# Patient Record
Sex: Female | Born: 1960 | Race: Black or African American | Hispanic: No | Marital: Single
Health system: Southern US, Community
[De-identification: ages and names within clinical notes are randomized; demographics above are authoritative.]

## PROBLEM LIST (undated history)

## (undated) DIAGNOSIS — I10 Essential (primary) hypertension: Secondary | ICD-10-CM

## (undated) HISTORY — PX: ABDOMINAL HYSTERECTOMY: SHX81

---

## 1997-08-10 ENCOUNTER — Emergency Department (HOSPITAL_COMMUNITY): Admission: EM | Admit: 1997-08-10 | Discharge: 1997-08-10 | Payer: Self-pay | Admitting: Emergency Medicine

## 1997-09-30 ENCOUNTER — Inpatient Hospital Stay (HOSPITAL_COMMUNITY): Admission: EM | Admit: 1997-09-30 | Discharge: 1997-10-05 | Payer: Self-pay | Admitting: Emergency Medicine

## 2005-04-21 ENCOUNTER — Emergency Department (HOSPITAL_COMMUNITY): Admission: EM | Admit: 2005-04-21 | Discharge: 2005-04-21 | Payer: Self-pay | Admitting: Emergency Medicine

## 2009-02-23 ENCOUNTER — Emergency Department (HOSPITAL_COMMUNITY): Admission: EM | Admit: 2009-02-23 | Discharge: 2009-02-23 | Payer: Self-pay | Admitting: Emergency Medicine

## 2010-05-22 NOTE — ED Provider Notes (Signed)
°

## 2011-04-03 ENCOUNTER — Encounter: Payer: Self-pay | Admitting: Emergency Medicine

## 2011-04-03 ENCOUNTER — Emergency Department (INDEPENDENT_AMBULATORY_CARE_PROVIDER_SITE_OTHER)
Admission: EM | Admit: 2011-04-03 | Discharge: 2011-04-03 | Disposition: A | Discharge status: Home / Self Care | Payer: Self-pay | Source: Home / Self Care | Attending: Emergency Medicine | Admitting: Emergency Medicine

## 2011-04-03 DIAGNOSIS — S8000XA Contusion of unspecified knee, initial encounter: Secondary | ICD-10-CM

## 2011-04-03 DIAGNOSIS — M62838 Other muscle spasm: Secondary | ICD-10-CM

## 2011-04-03 HISTORY — DX: Essential (primary) hypertension: I10

## 2011-04-03 MED ORDER — IBUPROFEN 600 MG PO TABS: 600.0000 mg | ORAL_TABLET | Freq: Three times a day (TID) | ORAL | Status: AC | PRN

## 2011-04-03 MED ORDER — CYCLOBENZAPRINE HCL 10 MG PO TABS: 10.0000 mg | ORAL_TABLET | Freq: Two times a day (BID) | ORAL | Status: AC | PRN
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
## 2011-04-03 NOTE — ED Notes (Signed)
See above

## 2011-04-27 ENCOUNTER — Emergency Department (HOSPITAL_COMMUNITY)
Admission: EM | Admit: 2011-04-27 | Discharge: 2011-04-27 | Disposition: A | Discharge status: Home / Self Care | Payer: Self-pay | Attending: Emergency Medicine | Admitting: Emergency Medicine

## 2011-04-27 ENCOUNTER — Emergency Department (HOSPITAL_COMMUNITY): Payer: Self-pay

## 2011-04-27 ENCOUNTER — Encounter (HOSPITAL_COMMUNITY): Payer: Self-pay | Admitting: Emergency Medicine

## 2011-04-27 DIAGNOSIS — M25569 Pain in unspecified knee: Secondary | ICD-10-CM | POA: Insufficient documentation

## 2011-04-27 DIAGNOSIS — M25561 Pain in right knee: Secondary | ICD-10-CM

## 2011-04-27 MED ORDER — IBUPROFEN 800 MG PO TABS: 800.0000 mg | ORAL_TABLET | Freq: Three times a day (TID) | ORAL | Status: AC

## 2011-04-27 NOTE — ED Notes (Signed)
Patient transported to X-ray and back 
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
## 2011-04-28 NOTE — ED Provider Notes (Signed)
Medical screening examination/treatment/procedure(s) were performed by non-physician practitioner and as supervising physician I was immediately available for consultation/collaboration.  Kyandra Mcclaine, MD 04/28/11 1838 

## 2015-12-08 ENCOUNTER — Encounter (HOSPITAL_COMMUNITY): Payer: Self-pay | Admitting: Emergency Medicine

## 2015-12-08 ENCOUNTER — Ambulatory Visit (HOSPITAL_COMMUNITY)
Admission: EM | Admit: 2015-12-08 | Discharge: 2015-12-08 | Disposition: A | Discharge status: Home / Self Care | Payer: Self-pay | Attending: Family Medicine | Admitting: Family Medicine

## 2015-12-08 DIAGNOSIS — S6991XA Unspecified injury of right wrist, hand and finger(s), initial encounter: Secondary | ICD-10-CM

## 2015-12-08 DIAGNOSIS — Z23 Encounter for immunization: Secondary | ICD-10-CM

## 2015-12-08 DIAGNOSIS — S6981XA Other specified injuries of right wrist, hand and finger(s), initial encounter: Secondary | ICD-10-CM

## 2015-12-08 DIAGNOSIS — W458XXA Other foreign body or object entering through skin, initial encounter: Secondary | ICD-10-CM

## 2015-12-08 MED ORDER — DOXYCYCLINE HYCLATE 100 MG PO CAPS: 100.0000 mg | ORAL_CAPSULE | Freq: Two times a day (BID) | ORAL | 0 refills | Status: DC

## 2015-12-08 MED ORDER — TETANUS-DIPHTH-ACELL PERTUSSIS 5-2.5-18.5 LF-MCG/0.5 IM SUSP
0.5000 mL | Freq: Once | INTRAMUSCULAR | Status: AC
  Administered 2015-12-08: 0.5 mL via INTRAMUSCULAR

## 2015-12-08 MED ORDER — TETANUS-DIPHTH-ACELL PERTUSSIS 5-2.5-18.5 LF-MCG/0.5 IM SUSP
INTRAMUSCULAR | Status: AC
  Filled 2015-12-08: qty 0.5

## 2015-12-08 NOTE — ED Provider Notes (Signed)
CSN: [id number]     Arrival date & time 12/08/15  1220 History   None    Chief Complaint  Patient presents with  . Hand Injury   (Consider location/radiation/quality/duration/timing/severity/associated sxs/prior Treatment) Patient was fishing yesterday and she got a fish hook in her right index finger.   The history is provided by the patient.  Hand Injury  Location:  Finger Finger location:  R index finger Injury: no   Pain details:    Quality:  Dull   Radiates to:  Does not radiate   Severity:  Mild   Onset quality:  Sudden   Duration:  1 day   Timing:  Constant   Progression:  Unchanged Handedness:  Right-handed Dislocation: no   Foreign body present:  Unable to specify (fishhook) Prior injury to area:  No Relieved by:  Nothing Worsened by:  Nothing Ineffective treatments:  None tried   Past Medical History:  Diagnosis Date  . Hypertension    Past Surgical History:  Procedure Laterality Date  . ABDOMINAL HYSTERECTOMY     History reviewed. No pertinent family history. Social History  Substance Use Topics  . Smoking status: Current Every Day Smoker    Packs/day: 1.00    Types: Cigarettes  . Smokeless tobacco: Never Used  . Alcohol use Yes   OB History    No data available     Review of Systems  Constitutional: Negative.   HENT: Negative.   Eyes: Negative.   Respiratory: Negative.   Cardiovascular: Negative.   Gastrointestinal: Negative.   Endocrine: Negative.   Genitourinary: Negative.   Musculoskeletal: Negative.   Skin: Positive for wound.       Fish hook in right finger  Allergic/Immunologic: Negative.   Psychiatric/Behavioral: Negative.     Allergies  Review of patient's allergies indicates no known allergies.  Home Medications   Prior to Admission medications   Medication Sig Start Date End Date Taking? Authorizing Provider  doxycycline (VIBRAMYCIN) 100 MG capsule Take 1 capsule (100 mg total) by mouth 2 (two) times daily. 12/08/15    Deatra Canter, FNP  HYDROCODONE-ACETAMINOPHEN PO Take 1 tablet by mouth every 6 (six) hours as needed. PAIN.  PT STATES SHE DOESN'T KNOW WHAT SHE WAS PUT ON FOR PAIN. DOESN'T REMEMBER WHERE IT WAS FILLED EITHER     Historical Provider, MD   Meds Ordered and Administered this Visit   Medications  Tdap (BOOSTRIX) injection 0.5 mL (0.5 mLs Intramuscular Given 12/08/15 1407)    BP (!) 163/103 (BP Location: Left Arm) Comment: notified rn  Pulse 110   Temp 98.8 F (37.1 C) (Oral)   Resp 16   SpO2 100%  No data found.   Physical Exam  Constitutional: She appears well-developed and well-nourished.  HENT:  Head: Normocephalic and atraumatic.  Eyes: Pupils are equal, round, and reactive to light.  Cardiovascular: Normal rate, regular rhythm and normal heart sounds.   Pulmonary/Chest: Effort normal and breath sounds normal.  Skin:  Right index finger with fish hook  Nursing note and vitals reviewed.   Urgent Care Course   Clinical Course    .Foreign Body Removal Date/Time: 12/08/2015 2:37 PM Performed by: Deatra Canter Authorized by: Deatra Canter  Consent: The procedure was performed in an emergent situation. Verbal consent obtained. Written consent not obtained. Risks and benefits: risks, benefits and alternatives were discussed Consent given by: patient Patient understanding: patient states understanding of the procedure being performed Patient consent: the patient's understanding of the procedure  matches consent given Imaging studies: imaging studies available Required items: required blood products, implants, devices, and special equipment available Patient identity confirmed: verbally with patient Time out: Immediately prior to procedure a "time out" was called to verify the correct patient, procedure, equipment, support staff and site/side marked as required. Body area: skin General location: upper extremity Location details: right index finger Anesthesia:  local infiltration  Anesthesia: Local Anesthetic: lidocaine 1% without epinephrine Anesthetic total: 3 mL  Sedation: Patient sedated: no Patient restrained: no Localization method: visualized Removal mechanism: forceps Dressing: antibiotic ointment and dressing applied Tendon involvement: none Depth: subcutaneous Complexity: simple 1 objects recovered. Foreign bodies recovered: Fishhook. Patient tolerance: Patient tolerated the procedure well with no immediate complications Comments: After prepping sight with betadine and anesthetizing the hook is pulled straight out w/o difficulty.   (including critical care time)  Labs Review Labs Reviewed - No data to display  Imaging Review No results found.   Visual Acuity Review  Right Eye Distance:   Left Eye Distance:   Bilateral Distance:    Right Eye Near:   Left Eye Near:    Bilateral Near:         MDM   1. Fish hook injury of finger, right, initial encounter        Deatra CanterWilliam J Birda Didonato, FNP 12/08/15 1439
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
## 2017-04-28 ENCOUNTER — Emergency Department (HOSPITAL_COMMUNITY): Payer: Medicaid Other

## 2017-04-28 ENCOUNTER — Observation Stay (HOSPITAL_COMMUNITY)
Admission: EM | Admit: 2017-04-28 | Discharge: 2017-04-29 | Disposition: A | Discharge status: Home / Self Care | Payer: Medicaid Other | Attending: Oncology | Admitting: Oncology

## 2017-04-28 ENCOUNTER — Encounter (HOSPITAL_COMMUNITY): Payer: Self-pay

## 2017-04-28 ENCOUNTER — Other Ambulatory Visit: Payer: Self-pay

## 2017-04-28 DIAGNOSIS — I10 Essential (primary) hypertension: Secondary | ICD-10-CM | POA: Insufficient documentation

## 2017-04-28 DIAGNOSIS — E870 Hyperosmolality and hypernatremia: Secondary | ICD-10-CM | POA: Diagnosis not present

## 2017-04-28 DIAGNOSIS — Z7289 Other problems related to lifestyle: Secondary | ICD-10-CM

## 2017-04-28 DIAGNOSIS — E860 Dehydration: Secondary | ICD-10-CM | POA: Insufficient documentation

## 2017-04-28 DIAGNOSIS — E875 Hyperkalemia: Secondary | ICD-10-CM

## 2017-04-28 DIAGNOSIS — R4182 Altered mental status, unspecified: Secondary | ICD-10-CM

## 2017-04-28 DIAGNOSIS — E872 Acidosis: Secondary | ICD-10-CM | POA: Diagnosis not present

## 2017-04-28 DIAGNOSIS — F17210 Nicotine dependence, cigarettes, uncomplicated: Secondary | ICD-10-CM | POA: Insufficient documentation

## 2017-04-28 DIAGNOSIS — T68XXXA Hypothermia, initial encounter: Secondary | ICD-10-CM

## 2017-04-28 DIAGNOSIS — Z720 Tobacco use: Secondary | ICD-10-CM

## 2017-04-28 DIAGNOSIS — F10929 Alcohol use, unspecified with intoxication, unspecified: Secondary | ICD-10-CM | POA: Diagnosis present

## 2017-04-28 DIAGNOSIS — Z79899 Other long term (current) drug therapy: Secondary | ICD-10-CM | POA: Diagnosis not present

## 2017-04-28 DIAGNOSIS — D72829 Elevated white blood cell count, unspecified: Secondary | ICD-10-CM

## 2017-04-28 DIAGNOSIS — Z833 Family history of diabetes mellitus: Secondary | ICD-10-CM

## 2017-04-28 DIAGNOSIS — F10129 Alcohol abuse with intoxication, unspecified: Principal | ICD-10-CM | POA: Insufficient documentation

## 2017-04-28 DIAGNOSIS — Z789 Other specified health status: Secondary | ICD-10-CM

## 2017-04-28 LAB — I-STAT VENOUS BLOOD GAS, ED
ACID-BASE DEFICIT: 6 mmol/L — AB (ref 0.0–2.0)
Bicarbonate: 22.4 mmol/L (ref 20.0–28.0)
O2 SAT: 55 %
TCO2: 24 mmol/L (ref 22–32)
pCO2, Ven: 52.6 mmHg (ref 44.0–60.0)
pH, Ven: 7.236 — ABNORMAL LOW (ref 7.250–7.430)
pO2, Ven: 34 mmHg (ref 32.0–45.0)

## 2017-04-28 LAB — COMPREHENSIVE METABOLIC PANEL
ALT: 23 U/L (ref 14–54)
AST: 29 U/L (ref 15–41)
Albumin: 4.3 g/dL (ref 3.5–5.0)
Alkaline Phosphatase: 114 U/L (ref 38–126)
Anion gap: 10 (ref 5–15)
BUN: 9 mg/dL (ref 6–20)
CO2: 21 mmol/L — ABNORMAL LOW (ref 22–32)
Calcium: 9.2 mg/dL (ref 8.9–10.3)
Chloride: 109 mmol/L (ref 101–111)
Creatinine, Ser: 0.72 mg/dL (ref 0.44–1.00)
GFR calc Af Amer: >60 mL/min (ref >60)
GFR calc non Af Amer: >60 mL/min (ref >60)
Glucose, Bld: 98 mg/dL (ref 65–99)
Potassium: 5.3 mmol/L — ABNORMAL HIGH (ref 3.5–5.1)
Sodium: 140 mmol/L (ref 135–145)
Total Bilirubin: 0.4 mg/dL (ref 0.3–1.2)
Total Protein: 8.7 g/dL — ABNORMAL HIGH (ref 6.5–8.1)

## 2017-04-28 LAB — CBC WITH DIFFERENTIAL/PLATELET
BASOS ABS: 0 10*3/uL (ref 0.0–0.1)
Basophils Relative: 0 %
Eosinophils Absolute: 0.1 10*3/uL (ref 0.0–0.7)
Eosinophils Relative: 0 %
HEMATOCRIT: 54.3 % — AB (ref 36.0–46.0)
HEMOGLOBIN: 18.4 g/dL — AB (ref 12.0–15.0)
LYMPHS PCT: 36 %
Lymphs Abs: 4.4 10*3/uL — ABNORMAL HIGH (ref 0.7–4.0)
MCH: 30.7 pg (ref 26.0–34.0)
MCHC: 33.9 g/dL (ref 30.0–36.0)
MCV: 90.5 fL (ref 78.0–100.0)
MONO ABS: 0.4 10*3/uL (ref 0.1–1.0)
Monocytes Relative: 3 %
NEUTROS ABS: 7.4 10*3/uL (ref 1.7–7.7)
Neutrophils Relative %: 61 %
Platelets: 307 10*3/uL (ref 150–400)
RBC: 6 MIL/uL — AB (ref 3.87–5.11)
RDW: 13.5 % (ref 11.5–15.5)
WBC: 12.3 10*3/uL — ABNORMAL HIGH (ref 4.0–10.5)

## 2017-04-28 LAB — RAPID URINE DRUG SCREEN, HOSP PERFORMED
Amphetamines: NOT DETECTED
Barbiturates: NOT DETECTED
Benzodiazepines: NOT DETECTED
Cocaine: NOT DETECTED
OPIATES: NOT DETECTED
Tetrahydrocannabinol: NOT DETECTED

## 2017-04-28 LAB — URINALYSIS, COMPLETE (UACMP) WITH MICROSCOPIC
Bilirubin Urine: NEGATIVE
Glucose, UA: NEGATIVE mg/dL
Hgb urine dipstick: NEGATIVE
KETONES UR: NEGATIVE mg/dL
LEUKOCYTES UA: NEGATIVE
Nitrite: NEGATIVE
PROTEIN: NEGATIVE mg/dL
Specific Gravity, Urine: 1.003 — ABNORMAL LOW (ref 1.005–1.030)
pH: 6 (ref 5.0–8.0)

## 2017-04-28 LAB — VOLATILES,BLD-ACETONE,ETHANOL,ISOPROP,METHANOL
Acetone, blood: NOT DETECTED % (ref 0.000–0.010)
Ethanol, blood: 0.143 % — ABNORMAL HIGH (ref 0.000–0.010)
Isopropanol, blood: NOT DETECTED % (ref 0.000–0.010)
METHANOL, BLOOD: NOT DETECTED % (ref 0.000–0.010)

## 2017-04-28 LAB — I-STAT BETA HCG BLOOD, ED (MC, WL, AP ONLY)

## 2017-04-28 LAB — AMMONIA: AMMONIA: 33 umol/L (ref 9–35)

## 2017-04-28 LAB — PROTIME-INR
INR: 1.15
Prothrombin Time: 14.6 seconds (ref 11.4–15.2)

## 2017-04-28 LAB — I-STAT CG4 LACTIC ACID, ED: Lactic Acid, Venous: 2.24 mmol/L (ref 0.5–1.9)

## 2017-04-28 LAB — TSH: TSH: 0.643 u[IU]/mL (ref 0.350–4.500)

## 2017-04-28 LAB — OSMOLALITY: OSMOLALITY: 348 mosm/kg — AB (ref 275–295)

## 2017-04-28 LAB — SALICYLATE LEVEL

## 2017-04-28 LAB — ACETAMINOPHEN LEVEL: Acetaminophen (Tylenol), Serum: <10 ug/mL — ABNORMAL LOW (ref 10–30)

## 2017-04-28 LAB — ETHANOL: Alcohol, Ethyl (B): 300 mg/dL — ABNORMAL HIGH (ref <10)

## 2017-04-28 MED ORDER — SODIUM CHLORIDE 0.9 % IV BOLUS (SEPSIS)
1000.0000 mL | Freq: Once | INTRAVENOUS | Status: AC
  Administered 2017-04-28: 1000 mL via INTRAVENOUS

## 2017-04-28 MED ORDER — FOLIC ACID 1 MG PO TABS: 1.0000 mg | ORAL_TABLET | Freq: Every day | ORAL | Status: DC

## 2017-04-28 MED ORDER — LORAZEPAM 2 MG/ML IJ SOLN: 1.0000 mg | Freq: Four times a day (QID) | INTRAMUSCULAR | Status: DC | PRN

## 2017-04-28 MED ORDER — LORAZEPAM 2 MG/ML IJ SOLN: 1.0000 mg | Freq: Once | INTRAMUSCULAR | Status: DC

## 2017-04-28 MED ORDER — LORAZEPAM 2 MG/ML IJ SOLN
0.2500 mg | Freq: Once | INTRAMUSCULAR | Status: AC
Start: 2017-04-28 — End: 2017-04-28
  Administered 2017-04-28: 0.25 mg via INTRAVENOUS
  Filled 2017-04-28: qty 1

## 2017-04-28 MED ORDER — THIAMINE HCL 100 MG/ML IJ SOLN: 100.0000 mg | Freq: Every day | INTRAMUSCULAR | Status: DC

## 2017-04-28 MED ORDER — LORAZEPAM 2 MG/ML IJ SOLN
0.5000 mg | Freq: Once | INTRAMUSCULAR | Status: AC
  Administered 2017-04-28: 0.5 mg via INTRAVENOUS
  Filled 2017-04-28: qty 1

## 2017-04-28 MED ORDER — VITAMIN B-1 100 MG PO TABS: 100.0000 mg | ORAL_TABLET | Freq: Every day | ORAL | Status: DC

## 2017-04-28 MED ORDER — ADULT MULTIVITAMIN W/MINERALS CH
1.0000 | ORAL_TABLET | Freq: Every day | ORAL | Status: DC
  Administered 2017-04-29: 1 via ORAL
  Filled 2017-04-28: qty 1

## 2017-04-28 MED ORDER — LABETALOL HCL 5 MG/ML IV SOLN
20.0000 mg | Freq: Once | INTRAVENOUS | Status: DC
  Filled 2017-04-28: qty 4

## 2017-04-28 MED ORDER — LORAZEPAM 2 MG/ML IJ SOLN
0.5000 mg | Freq: Four times a day (QID) | INTRAMUSCULAR | Status: DC
  Administered 2017-04-28 – 2017-04-29 (×3): 0.5 mg via INTRAVENOUS
  Filled 2017-04-28 (×3): qty 1

## 2017-04-28 MED ORDER — LORAZEPAM 1 MG PO TABS: 1.0000 mg | ORAL_TABLET | Freq: Four times a day (QID) | ORAL | Status: DC | PRN

## 2017-04-28 MED ORDER — NICOTINE 14 MG/24HR TD PT24
14.0000 mg | MEDICATED_PATCH | TRANSDERMAL | Status: DC
  Administered 2017-04-28: 14 mg via TRANSDERMAL
  Filled 2017-04-28: qty 1

## 2017-04-28 MED ORDER — NICOTINE POLACRILEX 2 MG MT GUM
2.0000 mg | CHEWING_GUM | OROMUCOSAL | Status: DC | PRN
  Filled 2017-04-28: qty 1

## 2017-04-28 MED ORDER — ENOXAPARIN SODIUM 40 MG/0.4ML ~~LOC~~ SOLN
40.0000 mg | SUBCUTANEOUS | Status: DC
  Administered 2017-04-28: 40 mg via SUBCUTANEOUS
  Filled 2017-04-28: qty 0.4

## 2017-04-28 MED ORDER — VITAMIN B-1 100 MG PO TABS
100.0000 mg | ORAL_TABLET | Freq: Every day | ORAL | Status: DC
  Administered 2017-04-29: 100 mg via ORAL
  Filled 2017-04-28: qty 1

## 2017-04-28 MED ORDER — FOLIC ACID 1 MG PO TABS
1.0000 mg | ORAL_TABLET | Freq: Every day | ORAL | Status: DC
  Administered 2017-04-29: 1 mg via ORAL
  Filled 2017-04-28: qty 1

## 2017-04-28 MED ORDER — ADULT MULTIVITAMIN W/MINERALS CH: 1.0000 | ORAL_TABLET | Freq: Every day | ORAL | Status: DC

## 2017-04-28 NOTE — ED Notes (Signed)
Attempted report x1. 
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
## 2017-04-28 NOTE — ED Notes (Signed)
Pt placed on 2l /[place] for 89 sart. Will inform MD.
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
## 2017-04-28 NOTE — ED Provider Notes (Addendum)
[facility]ENC[facility] Provider Note   CSN: [id number] Arrival date & time: 04/28/17  1044     History   Chief Complaint No chief complaint on file.   HPI Courtney Carpenter is a 56 y.o. female. Level 5 caveat- altered mental status HPI 56 year old female brought in via EMS with reports of altered mental status.  They report that her aunt last saw her sleeping on the sofa at 730 this morning in her normal state.  EMS was called in the mid morning by neighbors who saw her outside laying on the ground.  Reports that the patient has been very confused and speaking inappropriately.  They received no prior history of similar events, significant past medical history which would cause this, drug or alcohol use or abuse.  Reports checking blood sugar in route that was normal.  EMS reports that there were 340 ounce beers found at the scene. Past Medical History:  Diagnosis Date  . Hypertension     There are no active problems to display for this patient.   Past Surgical History:  Procedure Laterality Date  . ABDOMINAL HYSTERECTOMY      OB History    No data available       Home Medications    Prior to Admission medications   Medication Sig Start Date End Date Taking? Authorizing Provider  doxycycline (VIBRAMYCIN) 100 MG capsule Take 1 capsule (100 mg total) by mouth 2 (two) times daily. 12/08/15   Deatra Canterxford, William J, FNP  HYDROCODONE-ACETAMINOPHEN PO Take 1 tablet by mouth every 6 (six) hours as needed. PAIN.  PT STATES SHE DOESN'T KNOW WHAT SHE WAS PUT ON FOR PAIN. DOESN'T REMEMBER WHERE IT WAS FILLED EITHER     [provider]    Family History No family history on file.  Social History Social History   Tobacco Use  . Smoking status: Current Every Day Smoker    Packs/day: 1.00    Types: Cigarettes  . Smokeless tobacco: Never Used  Substance Use Topics  . Alcohol use: Yes  . Drug use: No     Allergies   Patient has no known  allergies.   Review of Systems Review of Systems  All other systems reviewed and are negative.    Physical Exam Updated Vital Signs There were no vitals taken for this visit.  Physical Exam  Constitutional: She appears well-developed and well-nourished.  HENT:  Head: Normocephalic and atraumatic.  Eyes: EOM are normal. Pupils are equal, round, and reactive to light.  Neck: Normal range of motion. Neck supple.  Cardiovascular: Normal rate.  Pulmonary/Chest: Effort normal.  Abdominal: Soft.  Musculoskeletal: Normal range of motion.  Neurological:  Patient does not follow commands She appears to move all 4 extremities equally and with good strength There is no abnormal tone noted Unable to assess deep tendon reflexes or sensation  Skin:  Extremities are very cool and core is somewhat cool No signs of trauma are noted  Psychiatric: Her affect is labile and inappropriate. She is agitated. She is inattentive.  Nursing note and vitals reviewed.    ED Treatments / Results  Labs (all labs ordered are listed, but only abnormal results are displayed) Labs Reviewed  CULTURE, BLOOD (ROUTINE X 2)  CULTURE, BLOOD (ROUTINE X 2)  ETHANOL  BLOOD GAS, VENOUS  RAPID URINE DRUG SCREEN, HOSP PERFORMED  COMPREHENSIVE METABOLIC PANEL  CBC WITH DIFFERENTIAL/PLATELET  PROTIME-INR  URINALYSIS, ROUTINE W REFLEX MICROSCOPIC  URINALYSIS, COMPLETE (UACMP) WITH MICROSCOPIC  AMMONIA  CBG MONITORING, ED  I-STAT BETA HCG BLOOD, ED ([facility], [facility], [facility] ONLY)    EKG  EKG Interpretation  Date/Time:  Sunday April 28 2017 11:00:27 EST Ventricular Rate:  111 PR Interval:    QRS Duration: 142 QT Interval:  389 QTC Calculation: 529 R Axis:   -154 Text Interpretation:  Sinus tachycardia Consider right atrial enlargement RBBB and LPFB Baseline wander in lead(s) V2 Confirmed by Sanye Ledesma ([id number]) on 04/28/2017 11:29:55 AM       Radiology Dg Chest Port 1 View  Result Date: 04/28/2017 CLINICAL  DATA:  56 year old female with a history of altered mental status EXAM: PORTABLE CHEST 1 VIEW COMPARISON:  None. FINDINGS: Cardiomediastinal silhouette within normal limits. Low lung volumes accentuate the interstitium. No evidence of central vascular congestion. No pneumothorax or pleural effusion. No confluent airspace disease. IMPRESSION: Low lung volumes without definite evidence of acute cardiopulmonary disease. Electronically Signed   By: Jaime  Wagner D.O.   On: 04/28/2017 12:35    Procedures Procedures (including critical care time)  Medications Ordered in ED Medications  sodium chloride 0.9 % bolus 1,000 mL (not administered)  labetalol (NORMODYNE,TRANDATE) injection 20 mg (not administered)  LORazepam (ATIVAN) injection 0.5 mg (not administered)     Initial Impression / Assessment and Plan / ED Course  I have reviewed the triage vital signs and the nursing notes.  Pertinent labs & imaging results that were available during my care of the patient were reviewed by me and considered in my medical decision making (see chart for details).     12 :40 PM Patient given 0.5 mg ativan and decreased sats to 88%, [place] placed with sats 100. SBP at 102- labetalol held  1- altered mental status- etoh level 300, uds pending CT head pending 2- metabolic acidosis- initial ph 1.197.23,  3- hypothermia- likely secondary to exposure- patient found outside Bair hugger in place Temperature to be rechecked 2:51 PM Temp increased to 97.6 4- leukocytosis-mild and nonpecific- patient tachycardiac and hypotensive which may all be due to etoh and hypothermia, blood cultures obtained and infection, especially aspiration in ddx 5- hypotension- patient initially reported hypertensive but on recheck here 100, then decreased with ativan 0.5 mg.  IV fluids infusing, lactic acid pending Hypotension improved with iv fluids 6- hyperkalemia- mild at 5.3 will need recheck after iv fluids 7- lactic acid elevated at  2.24- will need to recheck in 3 hours- plan additional fluid 2:51 PM Patient responds to verbal stimuli otherwise hemodynamically improved.  Plan admission for ongoing treatment and evaluation.   Vitals:   04/28/17 1330 04/28/17 1345  BP: 101/65 (!) 133/92  Pulse: 100 (!) 105  Resp: 15 15  Temp:    SpO2: 100% 97%   Discussed with Dr. Vincente LibertyMolt and she will see for internal medicine service  Final Clinical Impressions(s) / ED Diagnoses   Final diagnoses:  Altered mental status, unspecified altered mental status type  Alcohol abuse with intoxication (HCC)  Hyperkalemia  Leukocytosis, unspecified type    ED Discharge Orders    None       Margarita Grizzleay, Wilson Sample, MD 04/28/17 1510    Margarita Grizzleay, Christyne Mccain, MD 04/28/17 [id number]

## 2017-04-28 NOTE — Progress Notes (Signed)
Patient is verbally and physically aggressive and threatening to leave. She keeps popping up and walking around and will not sit down. Patient refused telemetry. She started yanking out her left IV so I removed it. She is walking around as a high fall risk but is unable to be redirected to sit down for her safety.

## 2017-04-28 NOTE — Progress Notes (Signed)
Courtney Carpenter is a 56 y.o. female patient admitted from ED awake, alert - oriented  X 4 - no acute distress noted.  VSS - Blood pressure 137/80, pulse (!) 114, temperature 98 F (36.7 C), temperature source Oral, resp. rate 20, SpO2 100 %.    IV in place, occlusive dsg intact without redness.  Orientation to room, and floor completed with information packet given to patient/family.  Patient declined safety video at this time.  Admission INP armband ID verified with patient/family, and in place.   SR up x 2, fall assessment complete, with patient and family able to verbalize understanding of risk associated with falls, and verbalized understanding to call nsg before up out of bed.  Call light within reach, patient able to voice, and demonstrate understanding.  Skin, clean-dry- intact without evidence of bruising, or skin tears.   No evidence of skin break down noted on exam.     Will cont to eval and treat per MD orders.  Marca AnconaLaura M Onna Nodal, RN 04/28/2017 5:17 PM

## 2017-04-28 NOTE — ED Notes (Signed)
Pt able to answer questions appropriately at this time.

## 2017-04-28 NOTE — ED Notes (Signed)
Dr. Rosalia Hammersay notified of I stat lactic results

## 2017-04-28 NOTE — ED Triage Notes (Signed)
Pt arrives from home via EMS after she was found altered outside of home this morning. Pt lkw last night prior to going to bed. Unknown time. Pt was found with slurred speech, AMS. 3 40oz beers found at the scene. Only medical hx is HTN. At baseline pt is a&o x 4 with no neuro deficit. Pt verbally aggressive to staff calling this rn a bitch and stating she wants to "sit my pussy on a penis".

## 2017-04-28 NOTE — ED Notes (Signed)
Lab results given to Dr.Ray. 

## 2017-04-28 NOTE — ED Notes (Signed)
Pt returns from ct. I/O cath performed by this nurse. With tech. Vernona RiegerLaura

## 2017-04-28 NOTE — Progress Notes (Signed)
Called to patient's room by RN who stated patient being aggressive with staff and wanting to leave AMA when staff trying to place telemetry. When arrived to room hospital security in place.  Patient agitated and stated that she didn't understand why she was in hospital and wanted to go home. She was informed that she needed to stay in hospital given metabolic abnormalities and dehydration observed on admission. Patient stated she didn't care and wanted to go home anyway, in spite of risks of alcohol withdrawal and current clinical state. Patient pulled out IV on L hand during conversation as she was preparing to leave.   Patient wasn't sure how she arrived at hospital but knew that she wouldn't be able to get a ride home from any friends or family. Patient informed that hospital staff will be unable to arrange ride home if she leaves AMA. Because she couldn't get a ride home, she eventually decided to stay in hospital overnight for IVF and monitoring for alcohol withdrawal. Will continue to monitor for signs/symptoms of alcohol withdrawal using CIWA protocol.

## 2017-04-28 NOTE — ED Notes (Signed)
Main lab called stating they did not receive urine specimen.

## 2017-04-29 DIAGNOSIS — F10129 Alcohol abuse with intoxication, unspecified: Secondary | ICD-10-CM | POA: Diagnosis not present

## 2017-04-29 DIAGNOSIS — E870 Hyperosmolality and hypernatremia: Secondary | ICD-10-CM | POA: Diagnosis not present

## 2017-04-29 DIAGNOSIS — E860 Dehydration: Secondary | ICD-10-CM | POA: Diagnosis not present

## 2017-04-29 DIAGNOSIS — I10 Essential (primary) hypertension: Secondary | ICD-10-CM | POA: Diagnosis not present

## 2017-04-29 LAB — BLOOD CULTURE ID PANEL (REFLEXED)
ACINETOBACTER BAUMANNII: NOT DETECTED
CANDIDA ALBICANS: NOT DETECTED
CANDIDA GLABRATA: NOT DETECTED
CANDIDA KRUSEI: NOT DETECTED
Candida parapsilosis: NOT DETECTED
Candida tropicalis: NOT DETECTED
ENTEROBACTER CLOACAE COMPLEX: NOT DETECTED
ENTEROBACTERIACEAE SPECIES: NOT DETECTED
ESCHERICHIA COLI: NOT DETECTED
Enterococcus species: NOT DETECTED
Haemophilus influenzae: NOT DETECTED
KLEBSIELLA PNEUMONIAE: NOT DETECTED
Klebsiella oxytoca: NOT DETECTED
Listeria monocytogenes: NOT DETECTED
NEISSERIA MENINGITIDIS: NOT DETECTED
PSEUDOMONAS AERUGINOSA: NOT DETECTED
Proteus species: NOT DETECTED
STREPTOCOCCUS AGALACTIAE: NOT DETECTED
STREPTOCOCCUS PNEUMONIAE: NOT DETECTED
Serratia marcescens: NOT DETECTED
Staphylococcus aureus (BCID): NOT DETECTED
Staphylococcus species: NOT DETECTED
Streptococcus pyogenes: NOT DETECTED
Streptococcus species: DETECTED — AB

## 2017-04-29 LAB — BASIC METABOLIC PANEL
ANION GAP: 10 (ref 5–15)
BUN: 8 mg/dL (ref 6–20)
CALCIUM: 8.6 mg/dL — AB (ref 8.9–10.3)
CO2: 19 mmol/L — AB (ref 22–32)
Chloride: 107 mmol/L (ref 101–111)
Creatinine, Ser: 0.81 mg/dL (ref 0.44–1.00)
Glucose, Bld: 120 mg/dL — ABNORMAL HIGH (ref 65–99)
POTASSIUM: 3.6 mmol/L (ref 3.5–5.1)
Sodium: 136 mmol/L (ref 135–145)

## 2017-04-29 LAB — HIV ANTIBODY (ROUTINE TESTING W REFLEX): HIV SCREEN 4TH GENERATION: NONREACTIVE

## 2017-04-29 LAB — LACTIC ACID, PLASMA: LACTIC ACID, VENOUS: 1 mmol/L (ref 0.5–1.9)

## 2017-04-29 MED ORDER — SODIUM CHLORIDE 0.9 % IV BOLUS (SEPSIS)
1000.0000 mL | Freq: Once | INTRAVENOUS | Status: AC
  Administered 2017-04-29: 1000 mL via INTRAVENOUS

## 2017-04-29 NOTE — Progress Notes (Signed)
PHARMACY - PHYSICIAN COMMUNICATION CRITICAL VALUE ALERT - BLOOD CULTURE IDENTIFICATION (BCID)  Courtney GearKimberly R Offer is an 56 y.o. female who [facility] with a chief complaint of alcohol intoxication  Assessment:  No signs of infection  Name of physician (or Provider) Contacted: Dr Bobbye MortonNedrun  Current antibiotics: None  Changes to prescribed antibiotics recommended:  Recommendations accepted by provider - likely contaminant   Results for orders placed or performed during the hospital encounter of 04/28/17  Blood Culture ID Panel (Reflexed) (Collected: 04/28/2017  1:25 PM)  Result Value Ref Range   Enterococcus species NOT DETECTED NOT DETECTED   Listeria monocytogenes NOT DETECTED NOT DETECTED   Staphylococcus species NOT DETECTED NOT DETECTED   Staphylococcus aureus NOT DETECTED NOT DETECTED   Streptococcus species DETECTED (A) NOT DETECTED   Streptococcus agalactiae NOT DETECTED NOT DETECTED   Streptococcus pneumoniae NOT DETECTED NOT DETECTED   Streptococcus pyogenes NOT DETECTED NOT DETECTED   Acinetobacter baumannii NOT DETECTED NOT DETECTED   Enterobacteriaceae species NOT DETECTED NOT DETECTED   Enterobacter cloacae complex NOT DETECTED NOT DETECTED   Escherichia coli NOT DETECTED NOT DETECTED   Klebsiella oxytoca NOT DETECTED NOT DETECTED   Klebsiella pneumoniae NOT DETECTED NOT DETECTED   Proteus species NOT DETECTED NOT DETECTED   Serratia marcescens NOT DETECTED NOT DETECTED   Haemophilus influenzae NOT DETECTED NOT DETECTED   Neisseria meningitidis NOT DETECTED NOT DETECTED   Pseudomonas aeruginosa NOT DETECTED NOT DETECTED   Candida albicans NOT DETECTED NOT DETECTED   Candida glabrata NOT DETECTED NOT DETECTED   Candida krusei NOT DETECTED NOT DETECTED   Candida parapsilosis NOT DETECTED NOT DETECTED   Candida tropicalis NOT DETECTED NOT DETECTED   Isaac BlissMichael Eliese Kerwood, PharmD, BCPS, BCCCP Clinical Pharmacist Clinical phone for 04/29/2017 from  7a-3:30p: [phone] If after 3:30p, please call main pharmacy at: [phone] 04/29/2017 8:49 AM

## 2017-04-29 NOTE — Progress Notes (Signed)
   Subjective: Patient seen sitting comfortably in bed this AM eating breakfast in no acute distress. She was very calm this AM and states that she feels much better today. Still has questions about how she got to hospital. No acute complaints.  Objective:  Vital signs in last 24 hours: Vitals:   04/28/17 1643 04/28/17 1713 04/29/17 0001 04/29/17 0629  BP: 131/68 137/80 (!) 158/84 (!) 191/109  Pulse: (!) 108 (!) 114 (!) 137 (!) 116  Resp:  20 (!) 24 (!) 28  Temp:  98 F (36.7 C) 99.5 F (37.5 C) (!) 97.4 F (36.3 C)  TempSrc:  Oral Oral Oral  SpO2:  100% 99% 95%   Physical Exam  Constitutional: She appears well-developed and well-nourished. No distress.  HENT:  Mouth/Throat: Oropharynx is clear and moist.  Cardiovascular: Normal rate, regular rhythm and intact distal pulses.  No murmur heard. Respiratory: Effort normal. No respiratory distress. She has no wheezes.  No crackles appreciated  GI: Soft. Bowel sounds are normal. She exhibits no distension. There is no tenderness.  Skin: Skin is warm and dry. No rash noted. She is not diaphoretic. No erythema.  Psychiatric:  Flat affect. Less combative and talkative today. Thought content normal. Logic linear.   Assessment/Plan:  Principal Problem:   Hypothermia Active Problems:   Alcohol intoxication (HCC)   Tobacco use   Alcohol use   Alcohol abuse with intoxication (HCC)  Trey SailorsKimberly Rushing is a 56 yo with a PMH of HTN who [facility] with altered mental status. Upon arrival she was found to be hypothermic (64F) with intermittent tachycardia (HR 120), hypotension (100s/70s), and an elevated lactate. She was also found to have an ethanol level of 300. She was admitted to the internal medicine teaching service for management. The specific problems addressed during admission were as follows:  Acute alcohol intoxication with elevated lactate: The patient received IVF, 3L in total overnight. Morning  lactic acid within normal limits. Does not appear dehydrated on exam today. Her behavior towards examiner and staff is much less aggressive today. Given these improvements, patient stable for discharge to home today. Will consult SW for transportation issues. -Admission labs significant for ethanol, negative for opiates, salicylate, acetaminophen -BMP with low bicarb, no anion gap -1/2 blood cultures positive for strep species, likely contaminant as patient has no signs or symptoms of infection -SW consulted for transportation issues  Hypertension: Patient reports previous history of hypertension; currently hypertensive, but reluctant to start med during acute illness. Recommend outpatient PCP follow up. -Continue to monitor -Outpatient follow up with PCP  FEN/GI: -Regular diet -Discontinue IVF after 3L yesterday  VTE Prophylaxis: Lovenox daily  Dispo: Anticipated discharge today.   Rozann LeschesNedrud, Talis Iwan, MD 04/29/2017, 10:57 AM Pager: [phone]

## 2017-04-29 NOTE — Discharge Instructions (Signed)
Alcohol Abuse and Nutrition °Alcohol abuse is any pattern of alcohol consumption that harms your health, relationships, or work. Alcohol abuse can affect how your body breaks down and absorbs nutrients from food by causing your liver to work abnormally. Additionally, many people who abuse alcohol do not eat enough carbohydrates, protein, fat, vitamins, and minerals. This can cause poor nutrition (malnutrition) and a lack of nutrients (nutrient deficiencies), which can lead to further complications. °Nutrients that are commonly lacking (deficient) among people who abuse alcohol include: °· Vitamins. °? Vitamin A. This is stored in your liver. It is important for your vision, metabolism, and ability to fight off infections (immunity). °? B vitamins. These include vitamins such as folate, thiamin, and niacin. These are important in new cell growth and maintenance. °? Vitamin C. This plays an important role in iron absorption, wound healing, and immunity. °? Vitamin D. This is produced by your liver, but you can also get vitamin D from food. Vitamin D is necessary for your body to absorb and use calcium. °· Minerals. °? Calcium. This is important for your bones and your heart and blood vessel (cardiovascular) function. °? Iron. This is important for blood, muscle, and nervous system functioning. °? Magnesium. This plays an important role in muscle and nerve function, and it helps to control blood sugar and blood pressure. °? Zinc. This is important for the normal function of your nervous system and digestive system (gastrointestinal tract). ° °Nutrition is an essential component of therapy for alcohol abuse. Your health care provider or dietitian will work with you to design a plan that can help restore nutrients to your body and prevent potential complications. °What is my plan? °Your dietitian may develop a specific diet plan that is based on your condition and any other complications you may have. A diet plan will  commonly include: °· A balanced diet. °? Grains: 6-8 oz per day. °? Vegetables: 2-3 cups per day. °? Fruits: 1-2 cups per day. °? Meat and other protein: 5-6 oz per day. °? Dairy: 2-3 cups per day. °· Vitamin and mineral supplements. ° °What do I need to know about alcohol and nutrition? °· Consume foods that are high in antioxidants, such as grapes, berries, nuts, green tea, and dark green and orange vegetables. This can help to counteract some of the stress that is placed on your liver by consuming alcohol. °· Avoid food and drinks that are high in fat and sugar. Foods such as sugared soft drinks, salty snack foods, and candy contain empty calories. This means that they lack important nutrients such as protein, fiber, and vitamins. °· Eat frequent meals and snacks. Try to eat 5-6 small meals each day. °· Eat a variety of fresh fruits and vegetables each day. This will help you get plenty of water, fiber, and vitamins in your diet. °· Drink plenty of water and other clear fluids. Try to drink at least 48-64 oz (1.5-2 L) of water per day. °· If you are a vegetarian, eat a variety of protein-rich foods. Pair whole grains with plant-based proteins at meals and snacks to obtain the greatest nutrient benefit from your food. For example, eat rice with beans, put peanut butter on whole-grain toast, or eat oatmeal with sunflower seeds. °· Soak beans and whole grains overnight before cooking. This can help your body to absorb the nutrients more easily. °· Include foods fortified with vitamins and minerals in your diet. Commonly fortified foods include milk, orange juice, cereal, and bread. °·   If you are malnourished, your dietitian may recommend a high-protein, high-calorie diet. This may include: ? 2,000-3,000 calories (kilocalories) per day. ? 70-100 grams of protein per day.  Your health care provider may recommend a complete nutritional supplement beverage. This can help to restore calories, protein, and vitamins to  your body. Depending on your condition, you may be advised to consume this instead of or in addition to meals.  Limit your intake of caffeine. Replace drinks like coffee and black tea with decaffeinated coffee and herbal tea.  Eat a variety of foods that are high in omega fatty acids. These include fish, nuts and seeds, and soybeans. These foods may help your liver to recover and may also stabilize your mood.  Certain medicines may cause changes in your appetite, taste, and weight. Work with your health care provider and dietitian to make any adjustments to your medicines and diet plan.  Include other healthy lifestyle choices in your daily routine. ? Be physically active. ? Get enough sleep. ? Spend time doing activities that you enjoy.  If you are unable to take in enough food and calories by mouth, your health care provider may recommend a feeding tube. This is a tube that passes through your nose and throat, directly into your stomach. Nutritional supplement beverages can be given to you through the feeding tube to help you get the nutrients you need.  Take vitamin or mineral supplements as recommended by your health care provider. What foods can I eat? Grains Enriched pasta. Enriched rice. Fortified whole-grain bread. Fortified whole-grain cereal. Barley. Brown rice. Quinoa. [place]. Vegetables All fresh, frozen, and canned vegetables. Spinach. Kale. Artichoke. Carrots. Winter squash and pumpkin. Sweet potatoes. Broccoli. Cabbage. Cucumbers. Tomatoes. Sweet peppers. Green beans. Peas. Corn. Fruits All fresh and frozen fruits. Berries. Grapes. Mango. Papaya. Guava. Cherries. Apples. Bananas. Peaches. Plums. Pineapple. Watermelon. Cantaloupe. Oranges. Avocado. Meats and Other Protein Sources Beef liver. Lean beef. Pork. Fresh and canned chicken. Fresh fish. Oysters. Sardines. Canned tuna. Shrimp. Eggs with yolks. Nuts and seeds. Peanut butter. Beans and lentils. Soybeans.  Tofu. Dairy Whole, low-fat, and nonfat milk. Whole, low-fat, and nonfat yogurt. Cottage cheese. Sour cream. Hard and soft cheeses. Beverages Water. Herbal tea. Decaffeinated coffee. Decaffeinated green tea. 100% fruit juice. 100% vegetable juice. Instant breakfast shakes. Condiments Ketchup. Mayonnaise. Mustard. Salad dressing. Barbecue sauce. Sweets and Desserts Sugar-free ice cream. Sugar-free pudding. Sugar-free gelatin. Fats and Oils Butter. Vegetable oil, flaxseed oil, olive oil, and walnut oil. Other Complete nutrition shakes. Protein bars. Sugar-free gum. The items listed above may not be a complete list of recommended foods or beverages. Contact your dietitian for more options. What foods are not recommended? Grains Sugar-sweetened breakfast cereals. Flavored instant oatmeal. Fried breads. Vegetables Breaded or deep-fried vegetables. Fruits Dried fruit with added sugar. Candied fruit. Canned fruit in syrup. Meats and Other Protein Sources Breaded or deep-fried meats. Dairy Flavored milks. Fried cheese curds or fried cheese sticks. Beverages Alcohol. Sugar-sweetened soft drinks. Sugar-sweetened tea. Caffeinated coffee and tea. Condiments Sugar. Honey. Agave nectar. Molasses. Sweets and Desserts Chocolate. Cake. Cookies. Candy. Other Potato chips. Pretzels. Salted nuts. Candied nuts. The items listed above may not be a complete list of foods and beverages to avoid. Contact your dietitian for more information. This information is not intended to replace advice given to you by your health care provider. Make sure you discuss any questions you have with your health care provider. Document Released: 02/08/2005 Document Revised: 08/24/2015 Document Reviewed: 11/17/2013 Elsevier Interactive Patient Education  2018 Elsevier Inc.   Alcohol Intoxication Alcohol intoxication happens when a person cannot think clearly or function well (becomes impaired) after drinking alcohol.  This condition can happen even after one drink. It can be dangerous, especially if:  The person drank a lot of alcohol in a short amount of time (binge drinking).  The person also took certain medicines or drugs.  If the intoxication is very bad, a breathing machine (ventilator) may be needed until the alcohol wears off. Follow these instructions at home: General instructions  Do not drive after drinking alcohol.  Have someone stay with you. You should not be left alone while you have this condition.  Make sure you have enough fluid in your body. To do this: ? Drink enough fluid to keep your pee (urine) clear or pale yellow. ? Avoid caffeine. It can make you thirsty.  Take over-the-counter and prescription medicines only as told by your doctor. To prevent this condition:  Limit alcohol intake to no more than 1 drink a day for nonpregnant women and 2 drinks a day for men. One drink equals 12 oz of beer, 5 oz of wine, or 1 oz of hard liquor.  Do not drink alcohol on an empty stomach.  Avoid drinking alcohol if: ? You are under the legal drinking age. ? You are pregnant or may be pregnant. ? You are taking medicines that you should not take with alcohol. ? Your drinking causes your medical condition to get worse. ? You need to drive or do activities that require your attention. ? You have substance use disorder. If this condition happens again:  Get help right away if you or someone you know: ? Has medium or very bad trouble with:  Movement (coordination).  Speech.  Memory.  Attention. ? Passes out (faints). ? Is confused. ? Is throwing up (vomiting).  Do not leave someone with this condition alone. Contact a doctor if:  You do not feel better after a few days.  You are having problems at work, at school, or at home because of drinking. Get help right away if:  You get shaky when you try to stop drinking.  You start shaking and you cannot control it (have a  seizure).  You throw up blood. The blood may be bright red or look like coffee grounds.  You see blood in your poop (stool). The blood may: ? Be bright red. ? Make your poop black and tarry and make it smell bad.  You start to feel light-headed.  You pass out. If you ever feel like you may hurt yourself or others, or have thoughts about taking your own life, get help right away. You can go to your nearest emergency department or call:  Your local emergency services (911 in the [place]  A suicide crisis helpline, such as the National Suicide Prevention Lifeline at 612-526-88871-272-158-1984. This is open 24 hours a day.  This information is not intended to replace advice given to you by your health care provider. Make sure you discuss any questions you have with your health care provider. Document Released: 10/03/2007 Document Revised: 01/05/2016 Document Reviewed: 01/05/2016 Elsevier Interactive Patient Education  2017 [employer]

## 2017-04-29 NOTE — Progress Notes (Signed)
Orson GearKimberly R Porras to be D/C'd Home per MD order.  Discussed with the patient and all questions fully answered.  VSS, Skin clean, dry and intact without evidence of skin break down, no evidence of skin tears noted. IV catheter discontinued intact. Site without signs and symptoms of complications. Dressing and pressure applied.  An After Visit Summary was printed and given to the patient. Patient received prescription.  D/c education completed with patient/family including follow up instructions, medication list, d/c activities limitations if indicated, with other d/c instructions as indicated by MD - patient able to verbalize understanding, all questions fully answered.   Patient instructed to return to ED, call 911, or call MD for any changes in condition.   Patient escorted via WC, and D/C home via private auto.  Eligah Eastrin M Jahayra Mazo 04/29/2017 12:11 PM

## 2017-04-29 NOTE — Care Management Note (Signed)
Case Management Note  Patient Details  Name: Courtney Carpenter MRN: [id number] Date of Birth: 05/07/1960  Subjective/Objective:                    Action/Plan: Pt discharging home with self care. No changes to her current medications. [facility]. [facility] Pt states she lives with her aunt who is not able to provide transportation home for the patient. Pt asking for a bus pass. CSW aware.  No further needs per CM.  Expected Discharge Date:  04/29/17               Expected Discharge Plan:  Home/Self Care  In-House Referral:     Discharge planning Services     Post Acute Care Choice:    Choice offered to:     DME Arranged:    DME Agency:     HH Arranged:    HH Agency:     Status of Service:  Completed, signed off  If discussed at [employer] Length of Stay Meetings, dates discussed:    Additional Comments:  Kermit BaloKelli F Melbourne Jakubiak, RN 04/29/2017, 12:08 PM

## 2017-04-29 NOTE — Discharge Summary (Signed)
Name: Courtney Carpenter MRN: [id number] DOB: 1960-08-20 56 y.o. PCP: Marliss Coots, NP  Date of Admission: 04/28/2017 10:44 AM Date of Discharge: 04/29/2017 Attending Physician: Dr. Murriel Hopper  Discharge Diagnosis:  Principal Problem:   Hypothermia Active Problems:   Alcohol intoxication ([place])   Tobacco use   Alcohol use   Alcohol abuse with intoxication [facility])  Discharge Medications: Allergies as of 04/29/2017   No Known Allergies     Medication List    You have not been prescribed any medications.    Disposition and follow-up:   Courtney Carpenter was discharged from [facility] in Stable condition.  At the hospital follow up visit please address:  1.  Patient was admitted to the hospital after an episode of acute intoxication. She was found to by hypothermic and body temp improved with warming blanket. She was given aggressive IV hydration and improved with this therapy. Please assess willingness to stop drinking, as family reports patient daily heavy drinker.   Patient's BP intermittently elevated during hospitalization. Please assess if she needs outpatient BP medications.  2.  Labs / imaging needed at time of follow-up: BP, as she had asymptomatic hypertension towards the end of admission  3.  Pending labs/ test needing follow-up: None  Follow-up Appointments: Follow-up Information    Placey, Audrea Muscat, NP. Schedule an appointment as soon as possible for a visit in 1 week(s).   Why:  Please make appointment in 1 week to discuss your hospitalization Contact information: [place] [address] [phone]           Hospital Course by problem list: Principal Problem:   Hypothermia Active Problems:   Alcohol intoxication ([place])   Tobacco use   Alcohol use   Alcohol abuse with intoxication ([place])   Courtney Carpenter is a 56 yo with a PMH of HTN who presented [facility] via EMS with altered  mental status. Upon arrival she was found to be hypothermic (38F) with intermittent tachycardia (HR 120), hypotension (100s/70s), and an elevated lactate. She was also found to have an ethanol level of 300. She was admitted to the internal medicine teaching service for management. The specific problems addressed during admission were as follows:  Acute alcohol intoxication with elevated lactate: The patient's lactate was elevated to 2.24 on arrival and she received 3L IVF overnight. Her initial BMP did not show an anion gap and no osmolar gap was observed on admission labs. After fluid resuscitation, her lactic acid within normal limits. Her other labs taken on admission, including UDS, salicylate, and acetaminophen returned negative. The patient met SIRS criteria on admission, so blood cultures were obtained. One of 2 blood cultures returned positive for strep species, which was thought to a contaminant as the patient remained afebrile, hemodynamically stable, and without signs or symptoms of infection during hospitalization. The patient's ethanol level was 300 on admission. She stated that she did not usually drink every day, but collateral reports from family suggest patient chronic alcoholic who drinks daily. Patient did not confirm this and was aggressive towards staff throughout hospitalization. She was monitored on CIWA and given scheduled Ativan 0.5 mg q6 hours to prevent withdrawal. Please assess patient's willingness to quit drinking.   Hypertension: Patient reports previous history of hypertension, but was hypo/normotensive on arrival. The patient was hypertensive at discharge and recommended follow up with PCP regarding BP management.  Discharge Vitals:   BP (!) 191/109 (BP Location: Left  Arm)   Pulse (!) 116   Temp (!) 97.4 F (36.3 C) (Oral)   Resp (!) 28   SpO2 95%   Pertinent Labs, Studies, and Procedures:   BMP Latest Ref Rng & Units 04/29/2017 04/28/2017  Glucose 65 - 99 mg/dL  120(H) 98  BUN 6 - 20 mg/dL 8 9  Creatinine 0.44 - 1.00 mg/dL 0.81 0.72  Sodium 135 - 145 mmol/L 136 140  Potassium 3.5 - 5.1 mmol/L 3.6 5.3(H)  Chloride 101 - 111 mmol/L 107 109  CO2 22 - 32 mmol/L 19(L) 21(L)  Calcium 8.9 - 10.3 mg/dL 8.6(L) 9.2   CBC Latest Ref Rng & Units 04/28/2017  WBC 4.0 - 10.5 K/uL 12.3(H)  Hemoglobin 12.0 - 15.0 g/dL 18.4(H)  Hematocrit 36.0 - 46.0 % 54.3(H)  Platelets 150 - 400 K/uL 307   Ethanol = 300 Serum Osmolality = 348  TSH = 0.643 Ammonia = 33  Lactate (serial, 12 hours later) = 2.24, 1.0  Drugs of Abuse     Component Value Date/Time   LABOPIA NONE DETECTED 04/28/2017 1410   COCAINSCRNUR NONE DETECTED 04/28/2017 1410   LABBENZ NONE DETECTED 04/28/2017 1410   AMPHETMU NONE DETECTED 04/28/2017 1410   THCU NONE DETECTED 04/28/2017 1410   LABBARB NONE DETECTED 04/28/2017 1410    Urinalysis    Component Value Date/Time   COLORURINE STRAW (A) 04/28/2017 1410   APPEARANCEUR CLEAR 04/28/2017 1410   LABSPEC 1.003 (L) 04/28/2017 1410   PHURINE 6.0 04/28/2017 1410   GLUCOSEU NEGATIVE 04/28/2017 1410   HGBUR NEGATIVE 04/28/2017 1410   BILIRUBINUR NEGATIVE 04/28/2017 1410   KETONESUR NEGATIVE 04/28/2017 1410   PROTEINUR NEGATIVE 04/28/2017 1410   NITRITE NEGATIVE 04/28/2017 [place] 04/28/2017 1410   Discharge Instructions: Discharge Instructions    Call MD for:  persistant dizziness or light-headedness   Complete by:  As directed    Call MD for:  persistant nausea and vomiting   Complete by:  As directed    Call MD for:  temperature >100.4   Complete by:  As directed    Diet - low sodium heart healthy   Complete by:  As directed    Discharge instructions   Complete by:  As directed    You were treated for electrolyte abnormalities that were caused by acute alcohol intoxication. While in the hospital your blood pressure was high. Please follow up with your primary care physician regarding your high blood pressure.     Increase activity slowly   Complete by:  As directed       Signed: Thomasene Ripple, MD 04/30/2017, 7:44 AM   Pager: [phone]

## 2017-04-30 LAB — CULTURE, BLOOD (ROUTINE X 2): SPECIAL REQUESTS: ADEQUATE

## 2017-05-04 LAB — CULTURE, BLOOD (ROUTINE X 2): Special Requests: ADEQUATE

## 2020-10-19 ENCOUNTER — Other Ambulatory Visit: Payer: Self-pay | Admitting: Internal Medicine

## 2020-10-19 DIAGNOSIS — Z1231 Encounter for screening mammogram for malignant neoplasm of breast: Secondary | ICD-10-CM

## 2020-10-19 DIAGNOSIS — M810 Age-related osteoporosis without current pathological fracture: Secondary | ICD-10-CM

## 2020-12-13 ENCOUNTER — Other Ambulatory Visit: Payer: Self-pay

## 2020-12-13 ENCOUNTER — Ambulatory Visit
Admission: RE | Admit: 2020-12-13 | Discharge: 2020-12-13 | Disposition: A | Discharge status: Home / Self Care | Payer: Medicare (Managed Care) | Source: Ambulatory Visit | Attending: Internal Medicine | Admitting: Internal Medicine

## 2020-12-13 DIAGNOSIS — Z1231 Encounter for screening mammogram for malignant neoplasm of breast: Secondary | ICD-10-CM

## 2021-02-06 ENCOUNTER — Other Ambulatory Visit (HOSPITAL_COMMUNITY): Payer: Self-pay | Admitting: Hospitalist

## 2021-02-06 DIAGNOSIS — R5383 Other fatigue: Secondary | ICD-10-CM

## 2021-02-06 DIAGNOSIS — R0609 Other forms of dyspnea: Secondary | ICD-10-CM

## 2021-02-21 ENCOUNTER — Other Ambulatory Visit: Payer: Self-pay

## 2021-02-21 ENCOUNTER — Ambulatory Visit (HOSPITAL_COMMUNITY): Payer: Medicare (Managed Care) | Attending: Internal Medicine

## 2021-02-21 DIAGNOSIS — R0609 Other forms of dyspnea: Secondary | ICD-10-CM | POA: Diagnosis present

## 2021-02-21 DIAGNOSIS — R5383 Other fatigue: Secondary | ICD-10-CM | POA: Insufficient documentation

## 2021-02-21 LAB — ECHOCARDIOGRAM COMPLETE
Area-P 1/2: 5.66 cm2
S' Lateral: 2.5 cm

## 2021-04-03 ENCOUNTER — Other Ambulatory Visit: Payer: Self-pay

## 2021-08-02 ENCOUNTER — Other Ambulatory Visit: Payer: Self-pay | Admitting: Hospitalist

## 2021-08-25 ENCOUNTER — Other Ambulatory Visit: Payer: Self-pay | Admitting: Internal Medicine

## 2021-08-25 ENCOUNTER — Ambulatory Visit
Admission: RE | Admit: 2021-08-25 | Discharge: 2021-08-25 | Disposition: A | Discharge status: Home / Self Care | Payer: Self-pay | Source: Ambulatory Visit | Attending: Internal Medicine | Admitting: Internal Medicine

## 2021-08-25 DIAGNOSIS — Z13820 Encounter for screening for osteoporosis: Secondary | ICD-10-CM

## 2021-09-21 ENCOUNTER — Other Ambulatory Visit: Payer: Self-pay | Admitting: Family Medicine

## 2021-09-21 DIAGNOSIS — Z0001 Encounter for general adult medical examination with abnormal findings: Secondary | ICD-10-CM

## 2021-10-23 ENCOUNTER — Other Ambulatory Visit: Payer: Medicare (Managed Care)

## 2021-11-20 ENCOUNTER — Ambulatory Visit
Admission: RE | Admit: 2021-11-20 | Discharge: 2021-11-20 | Disposition: A | Discharge status: Home / Self Care | Payer: Medicare (Managed Care) | Source: Ambulatory Visit | Attending: Family Medicine | Admitting: Family Medicine

## 2021-11-20 DIAGNOSIS — Z0001 Encounter for general adult medical examination with abnormal findings: Secondary | ICD-10-CM

## 2022-01-18 ENCOUNTER — Other Ambulatory Visit (HOSPITAL_COMMUNITY): Payer: Self-pay | Admitting: Family Medicine

## 2022-01-18 DIAGNOSIS — I495 Sick sinus syndrome: Secondary | ICD-10-CM

## 2022-02-23 ENCOUNTER — Ambulatory Visit (HOSPITAL_COMMUNITY)
Admission: RE | Admit: 2022-02-23 | Discharge: 2022-02-23 | Disposition: A | Discharge status: Home / Self Care | Payer: Medicare (Managed Care) | Source: Ambulatory Visit | Attending: Family Medicine | Admitting: Family Medicine

## 2022-02-23 DIAGNOSIS — R000 Tachycardia, unspecified: Secondary | ICD-10-CM | POA: Insufficient documentation

## 2022-02-23 DIAGNOSIS — I4510 Unspecified right bundle-branch block: Secondary | ICD-10-CM | POA: Diagnosis present

## 2022-06-27 ENCOUNTER — Other Ambulatory Visit: Payer: Self-pay

## 2022-06-27 DIAGNOSIS — Z1231 Encounter for screening mammogram for malignant neoplasm of breast: Secondary | ICD-10-CM

## 2022-08-10 ENCOUNTER — Inpatient Hospital Stay: Admission: RE | Admit: 2022-08-10 | Payer: Medicare (Managed Care) | Source: Ambulatory Visit

## 2022-09-19 ENCOUNTER — Ambulatory Visit: Payer: Medicare (Managed Care)

## 2022-10-01 ENCOUNTER — Ambulatory Visit
Admission: RE | Admit: 2022-10-01 | Discharge: 2022-10-01 | Disposition: A | Discharge status: Home / Self Care | Payer: Medicare (Managed Care) | Source: Ambulatory Visit

## 2022-10-01 DIAGNOSIS — Z1231 Encounter for screening mammogram for malignant neoplasm of breast: Secondary | ICD-10-CM

## 2023-04-14 IMAGING — MG MM DIGITAL SCREENING BILAT W/ TOMO AND CAD
8 series · 9 of 24 positions shown · non-contrast
Comparison: None.

CLINICAL DATA: Screening.

EXAM:
DIGITAL SCREENING BILATERAL MAMMOGRAM WITH TOMOSYNTHESIS AND CAD
TECHNIQUE: Bilateral screening digital craniocaudal and mediolateral oblique
mammograms were obtained. Bilateral screening digital breast
tomosynthesis was performed. The images were evaluated with
computer-aided detection.

[L MLO synth-2D]
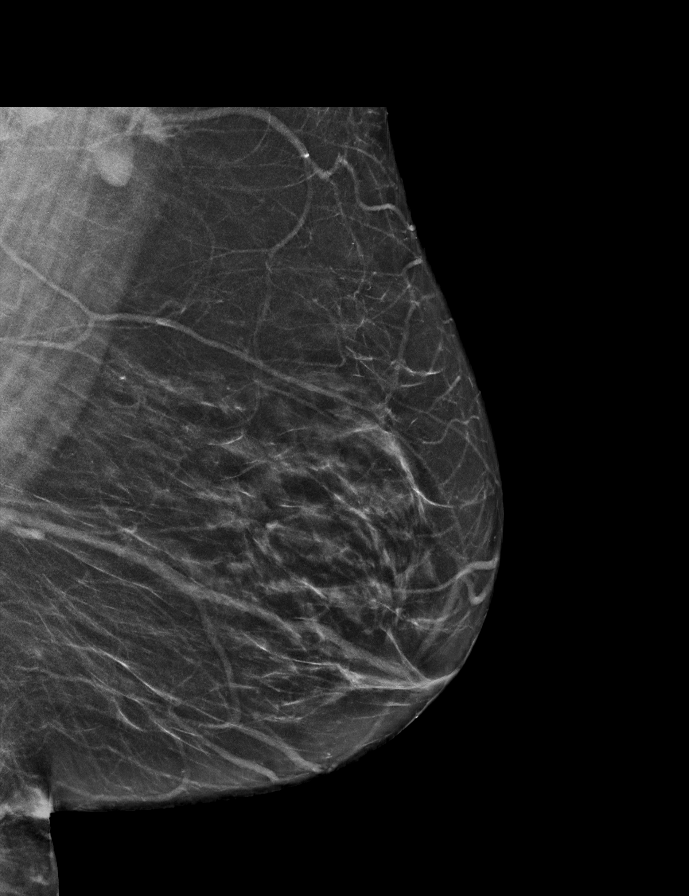

[L CC synth-2D]
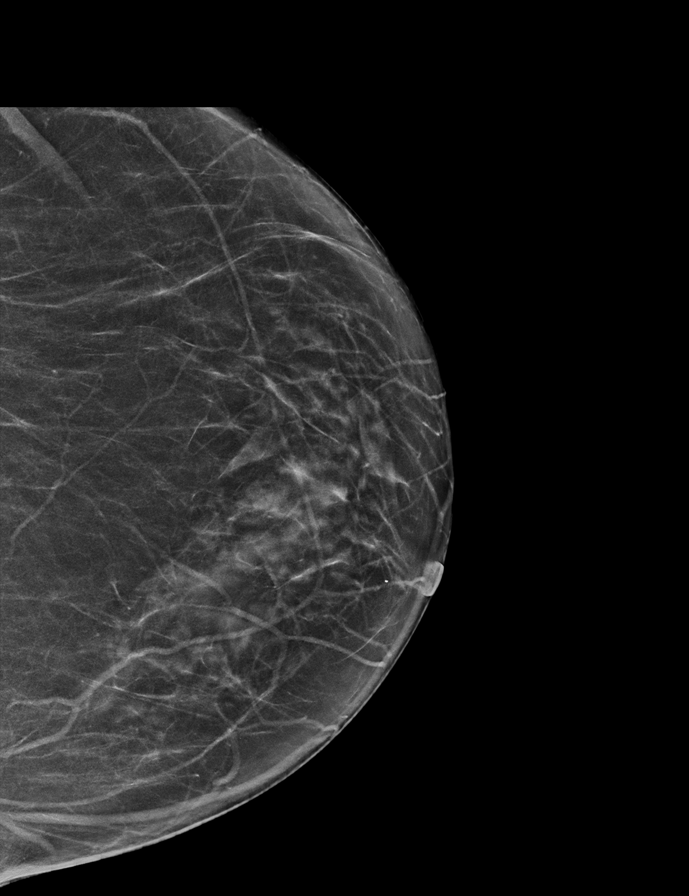

[R MLO synth-2D]
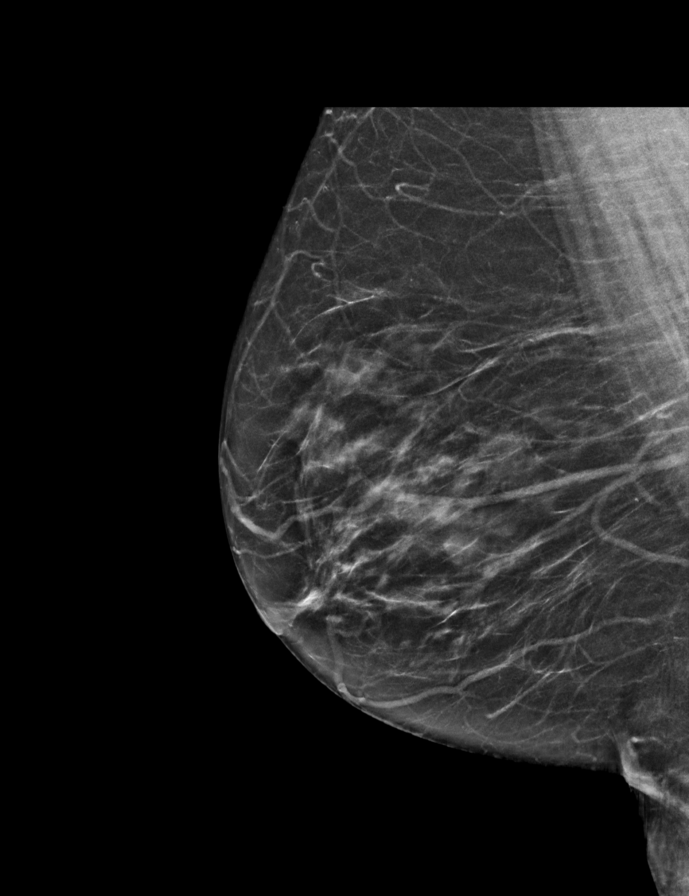

[R CC synth-2D]
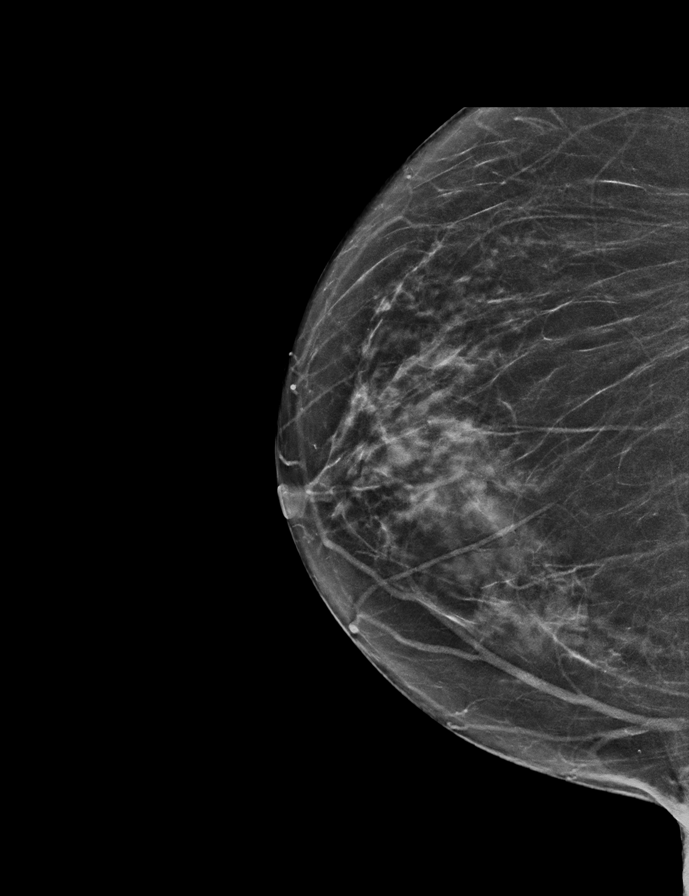

[L CC tomo · 2 of 55 frames shown]
[frame 18/55]
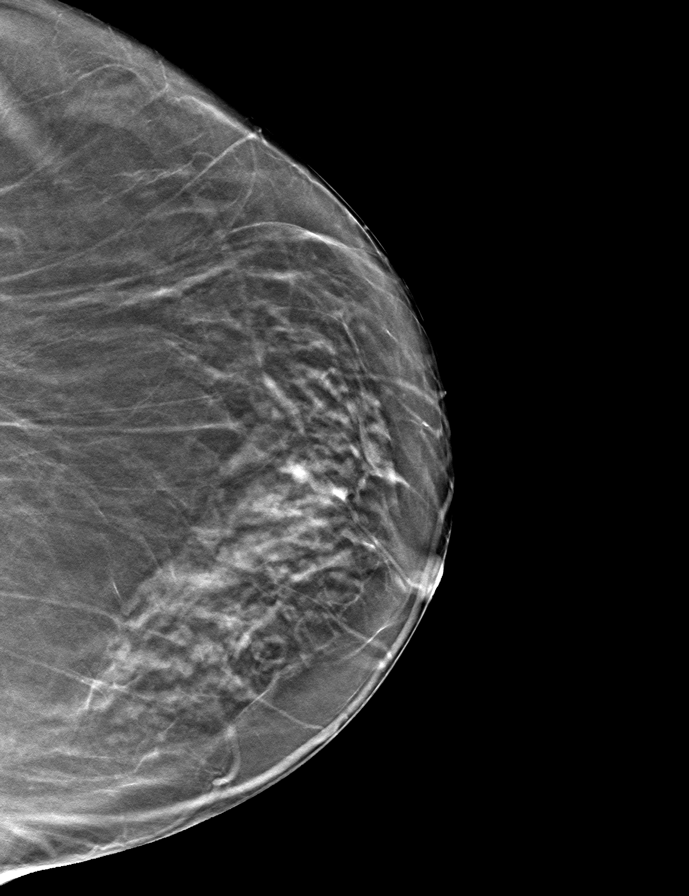
[frame 28/55]
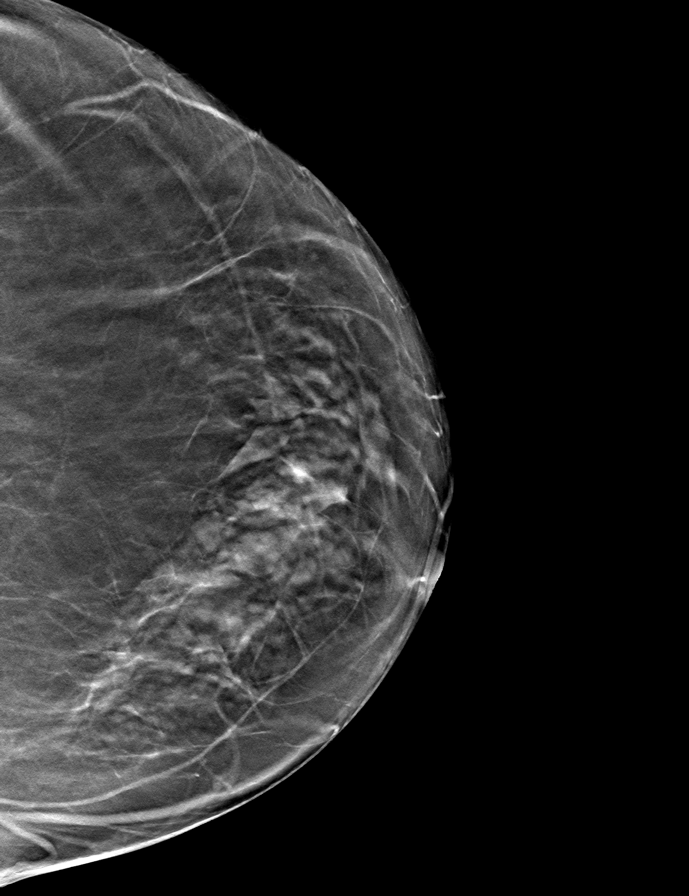

[L MLO tomo · tomo slice 30/59.0]
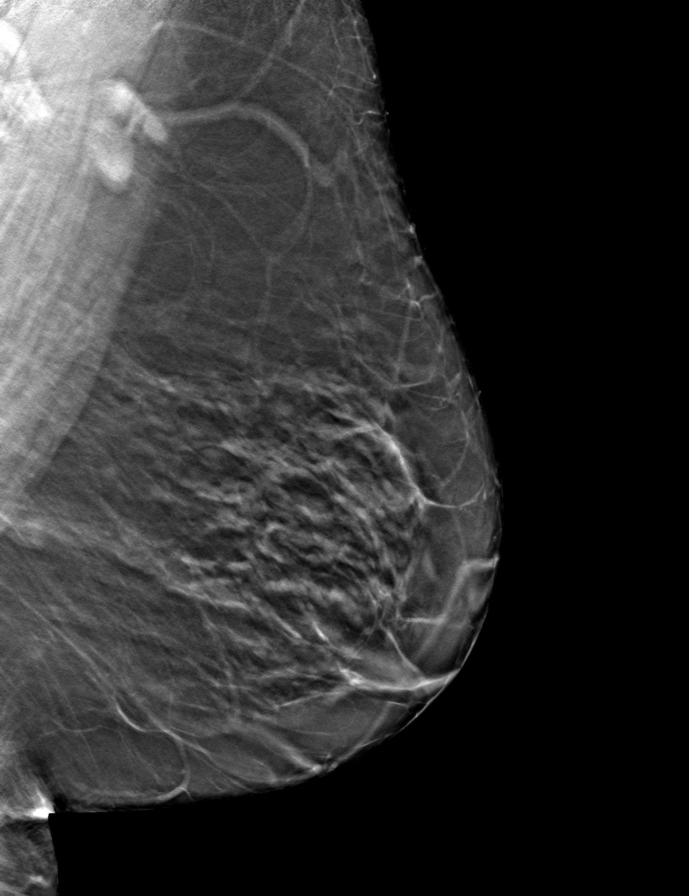

[R CC tomo · tomo slice 27/54.0]
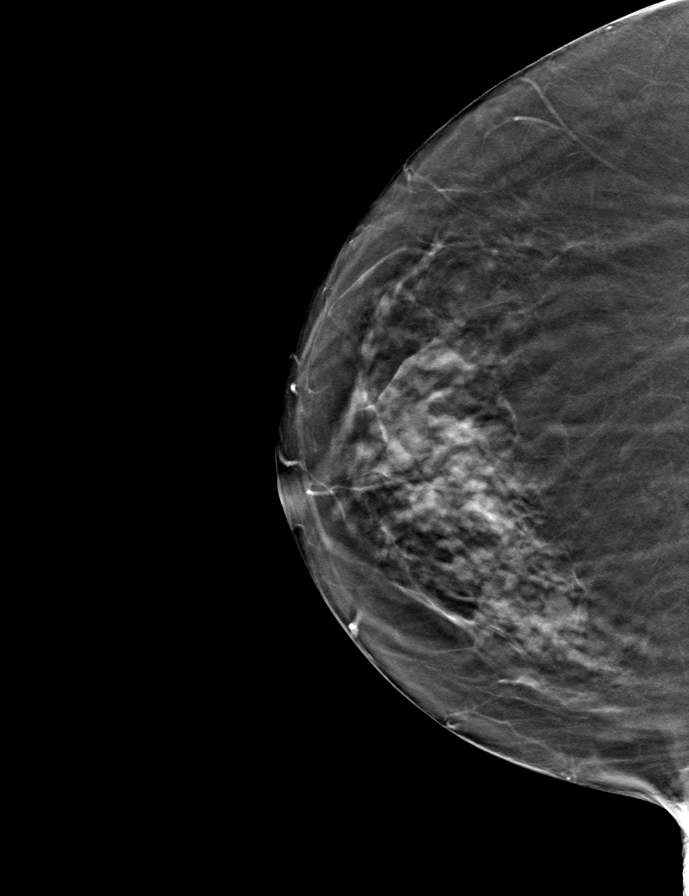

[R MLO tomo · tomo slice 28/55.0]
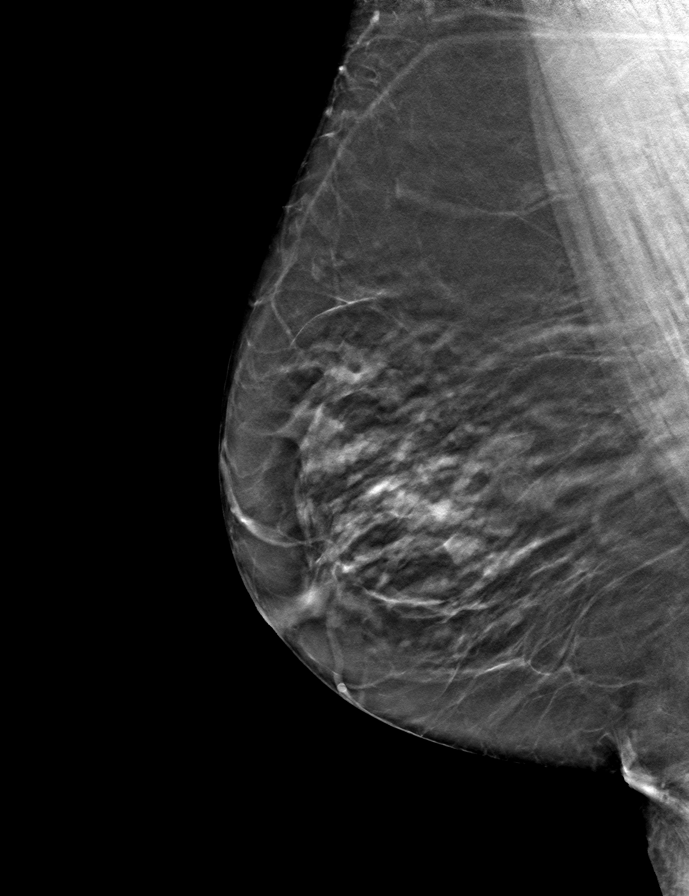

[9 of 24 positions shown; findings below may reference images not displayed]

ACR Breast Density Category b: There are scattered areas of
fibroglandular density.
FINDINGS: There are no findings suspicious for malignancy.
IMPRESSION: No mammographic evidence of malignancy. A result letter of this
screening mammogram will be mailed directly to the patient.

RECOMMENDATION:
Screening mammogram in one year. (Code:XG-X-X7B)

BI-RADS CATEGORY  1: Negative.

## 2023-04-22 ENCOUNTER — Encounter: Payer: Self-pay | Admitting: Emergency Medicine

## 2023-07-15 ENCOUNTER — Other Ambulatory Visit: Payer: Self-pay | Admitting: Physician Assistant

## 2023-07-15 DIAGNOSIS — Z1231 Encounter for screening mammogram for malignant neoplasm of breast: Secondary | ICD-10-CM

## 2023-10-02 ENCOUNTER — Ambulatory Visit
Admission: RE | Admit: 2023-10-02 | Discharge: 2023-10-02 | Disposition: A | Discharge status: Home / Self Care | Payer: Medicare (Managed Care) | Source: Ambulatory Visit | Attending: Physician Assistant | Admitting: Physician Assistant

## 2023-10-02 DIAGNOSIS — Z1231 Encounter for screening mammogram for malignant neoplasm of breast: Secondary | ICD-10-CM
